# Patient Record
Sex: Female | Born: 1945 | Hispanic: Yes | Marital: Single | Smoking: Never smoker
Health system: Southern US, Community
[De-identification: ages and names within clinical notes are randomized; demographics above are authoritative.]

---

## 2014-08-04 ENCOUNTER — Emergency Department (HOSPITAL_COMMUNITY)
Admission: EM | Admit: 2014-08-04 | Discharge: 2014-08-04 | Disposition: A | Discharge status: Home / Self Care | Payer: Self-pay | Attending: Emergency Medicine | Admitting: Emergency Medicine

## 2014-08-04 ENCOUNTER — Emergency Department (HOSPITAL_COMMUNITY): Payer: Medicare Other

## 2014-08-04 ENCOUNTER — Encounter (HOSPITAL_COMMUNITY): Payer: Self-pay | Admitting: Emergency Medicine

## 2014-08-04 ENCOUNTER — Emergency Department (HOSPITAL_COMMUNITY): Payer: Self-pay

## 2014-08-04 DIAGNOSIS — IMO0001 Reserved for inherently not codable concepts without codable children: Secondary | ICD-10-CM

## 2014-08-04 DIAGNOSIS — S43004A Unspecified dislocation of right shoulder joint, initial encounter: Secondary | ICD-10-CM

## 2014-08-04 DIAGNOSIS — S43031A Inferior subluxation of right humerus, initial encounter: Secondary | ICD-10-CM | POA: Insufficient documentation

## 2014-08-04 DIAGNOSIS — Y9259 Other trade areas as the place of occurrence of the external cause: Secondary | ICD-10-CM | POA: Insufficient documentation

## 2014-08-04 DIAGNOSIS — W010XXA Fall on same level from slipping, tripping and stumbling without subsequent striking against object, initial encounter: Secondary | ICD-10-CM | POA: Insufficient documentation

## 2014-08-04 DIAGNOSIS — Y999 Unspecified external cause status: Secondary | ICD-10-CM | POA: Insufficient documentation

## 2014-08-04 DIAGNOSIS — Y9389 Activity, other specified: Secondary | ICD-10-CM | POA: Insufficient documentation

## 2014-08-04 DIAGNOSIS — S43011A Anterior subluxation of right humerus, initial encounter: Secondary | ICD-10-CM | POA: Insufficient documentation

## 2014-08-04 MED ORDER — ONDANSETRON HCL 4 MG/2ML IJ SOLN
4.0000 mg | Freq: Once | INTRAMUSCULAR | Status: AC
  Administered 2014-08-04: 4 mg via INTRAVENOUS
  Filled 2014-08-04: qty 2

## 2014-08-04 MED ORDER — TRAMADOL HCL 50 MG PO TABS: 50.0000 mg | ORAL_TABLET | Freq: Four times a day (QID) | ORAL | Status: AC | PRN

## 2014-08-04 MED ORDER — ETOMIDATE 2 MG/ML IV SOLN
INTRAVENOUS | Status: AC | PRN
  Administered 2014-08-04: 10 mg via INTRAVENOUS

## 2014-08-04 MED ORDER — SODIUM CHLORIDE 0.9 % IV BOLUS (SEPSIS)
500.0000 mL | Freq: Once | INTRAVENOUS | Status: AC
  Administered 2014-08-04: 500 mL via INTRAVENOUS

## 2014-08-04 MED ORDER — ETOMIDATE 2 MG/ML IV SOLN
10.0000 mg | Freq: Once | INTRAVENOUS | Status: AC
  Filled 2014-08-04: qty 10

## 2014-08-04 NOTE — ED Notes (Signed)
Xray tech at bedside.

## 2014-08-04 NOTE — ED Notes (Signed)
[facility] Expected date:  Expected time:  Means of arrival:  Comments: 69 y/o F fall

## 2014-08-04 NOTE — ED Notes (Signed)
Pt denies any pain in her neck or lower arm. She only has complaints of pain in her right shoulder.

## 2014-08-04 NOTE — Discharge Instructions (Signed)
In patients over 71 years old, the rate of redislocation is lower and early mobilization (after one week) is needed to limit joint stiffness. Gentle pendular motion exercises should be performed during the immobilization period to reduce the risk of frozen shoulder.

## 2014-08-04 NOTE — ED Provider Notes (Signed)
CSN: [id number]     Arrival date & time 08/04/14  1914 History   First MD Initiated Contact with Patient 08/04/14 2022     Chief Complaint  Patient presents with  . Arm Injury     (Consider location/radiation/quality/duration/timing/severity/associated sxs/prior Treatment) Patient is a 69 y.o. female presenting with arm injury. The history is provided by the patient.  Arm Injury Location:  Shoulder Time since incident:  3 hours Injury: yes   Mechanism of injury: fall   Fall:    Fall occurred: from standing.   Height of fall:  Standing   Impact surface:  Hard floor   Point of impact: right arm. Shoulder location:  R shoulder Pain details:    Quality:  Aching   Radiates to:  Does not radiate   Severity:  Moderate   Onset quality:  Sudden   Duration:  3 hours   Timing:  Constant   Progression:  Unchanged Chronicity:  New Dislocation: yes   Prior injury to area:  No Relieved by:  Nothing Worsened by:  Nothing tried Ineffective treatments: fentanyl. Associated symptoms: no back pain, no fatigue, no fever and no neck pain     History reviewed. No pertinent past medical history. History reviewed. No pertinent past surgical history. No family history on file. History  Substance Use Topics  . Smoking status: Never Smoker   . Smokeless tobacco: Not on file  . Alcohol Use: No   OB History    No data available     Review of Systems  Constitutional: Negative for fever and fatigue.  HENT: Negative for congestion and drooling.   Eyes: Negative for pain.  Respiratory: Negative for cough and shortness of breath.   Cardiovascular: Negative for chest pain.  Gastrointestinal: Negative for nausea, vomiting, abdominal pain and diarrhea.  Genitourinary: Negative for dysuria and hematuria.  Musculoskeletal: Negative for back pain, gait problem and neck pain.  Skin: Negative for color change.  Neurological: Negative for dizziness and headaches.  Hematological: Negative for  adenopathy.  Psychiatric/Behavioral: Negative for behavioral problems.  All other systems reviewed and are negative.     Allergies  Review of patient's allergies indicates no known allergies.  Home Medications   Prior to Admission medications   Not on File   BP 205/75 mmHg  Pulse 54  Temp(Src) 98 F (36.7 C) (Oral)  SpO2 90% Physical Exam  Constitutional: She is oriented to person, place, and time. She appears well-developed and well-nourished.  HENT:  Head: Normocephalic.  Mouth/Throat: Oropharynx is clear and moist. No oropharyngeal exudate.  Eyes: Conjunctivae and EOM are normal. Pupils are equal, round, and reactive to light.  Neck: Normal range of motion. Neck supple.  Cardiovascular: Normal rate, regular rhythm, normal heart sounds and intact distal pulses.  Exam reveals no gallop and no friction rub.   No murmur heard. Pulmonary/Chest: Effort normal and breath sounds normal. No respiratory distress. She has no wheezes.  Abdominal: Soft. Bowel sounds are normal. There is no tenderness. There is no rebound and no guarding.  Musculoskeletal: Normal range of motion. She exhibits tenderness. She exhibits no edema.  Tenderness to the right anterior shoulder with deformity consistent with anterior shoulder dislocation.  Normal motor skills in the right hand. Sensation intact in the right upper extremity. 2+ distal pulses in the right upper extremity.  Normal range of motion and strength in all other extremities.  No vertebral tenderness noted.  Neurological: She is alert and oriented to person, place, and time.  Skin: Skin is warm and dry.  Psychiatric: She has a normal mood and affect. Her behavior is normal.  Nursing note and vitals reviewed.   ED Course  Reduction of dislocation Date/Time: 08/04/2014 10:08 PM Performed by: Purvis Sheffield Authorized by: Purvis Sheffield Consent: Verbal consent obtained. Written consent obtained. Risks and benefits: risks,  benefits and alternatives were discussed Consent given by: patient (son) Patient understanding: patient states understanding of the procedure being performed Patient consent: the patient's understanding of the procedure matches consent given Procedure consent: procedure consent matches procedure scheduled Relevant documents: relevant documents present and verified Test results: test results available and properly labeled Site marked: the operative site was marked Imaging studies: imaging studies available Required items: required blood products, implants, devices, and special equipment available Patient identity confirmed: verbally with patient, arm band, provided demographic data and hospital-assigned identification number Time out: Immediately prior to procedure a "time out" was called to verify the correct patient, procedure, equipment, support staff and site/side marked as required. Preparation: Patient was prepped and draped in the usual sterile fashion. Local anesthesia used: no Patient sedated: yes Sedatives: etomidate Sedation start date/time: 08/04/2014 10:08 PM Sedation end date/time: 08/04/2014 10:16 PM Vitals: Vital signs were monitored during sedation. Patient tolerance: Patient tolerated the procedure well with no immediate complications   (including critical care time) Labs Review Labs Reviewed - No data to display  Imaging Review Dg Shoulder Right  08/04/2014   CLINICAL DATA:  Tripped and fell while shopping. Pain in deformity of the right shoulder.  EXAM: RIGHT SHOULDER - 2+ VIEW  COMPARISON:  None.  FINDINGS: There is anterior inferior dislocation of the humeral head relative to the glenoid. No visible fracture, though Hill-Sachs deformity is possible. Large subacromial spur incidentally noted.  IMPRESSION: Anterior inferior dislocation of the humeral head.   Electronically Signed   By: Paulina Fusi M.D.   On: 08/04/2014 20:18     EKG Interpretation None      MDM    Final diagnoses:  Shoulder dislocation, right, initial encounter    9:07 PM 69 y.o. female who presents with a right anterior shoulder dislocation. The patient speaks Spanish and her son is here interpreting. He states they were eating [place] food and relieving around 6 PM when she tripped falling forward and breaking her fall with her right arm. She denies hitting her head or loss of consciousness. She denies any headache. Her current and only complaint is right shoulder pain. She has an anterior and inferior right shoulder dislocation. She is neurovascularly intact on exam. She has had 2 episodes of emesis after getting 150 mg of fentanyl.  11:52 PM: Pt s/p reduction. Plain film reviewed by me. She remains neurovasc intact. Will rec she wear sling and warned against frozen shoulder.  I have discussed the diagnosis/risks/treatment options with the patient and family and believe the pt to be eligible for discharge home to follow-up with Ortho. We also discussed returning to the ED immediately if new or worsening sx occur. We discussed the sx which are most concerning (e.g., worsening pain, weakness) that necessitate immediate return. Medications administered to the patient during their visit and any new prescriptions provided to the patient are listed below.  Medications given during this visit Medications  ondansetron (ZOFRAN) injection 4 mg (4 mg Intravenous Given 08/04/14 1958)  etomidate (AMIDATE) injection 10 mg ( Intravenous Duplicate 08/04/14 2230)  sodium chloride 0.9 % bolus 500 mL (0 mLs Intravenous Stopped 08/04/14 2245)  etomidate (AMIDATE) injection (  10 mg Intravenous Given 08/04/14 2211)    New Prescriptions   TRAMADOL (ULTRAM) 50 MG TABLET    Take 1 tablet (50 mg total) by mouth every 6 (six) hours as needed.     Purvis Sheffield, MD 08/04/14 [phone]

## 2014-08-04 NOTE — ED Notes (Signed)
[place] from [place] and speaks no [place]. Her son is at the bedside and does speak [place] fluently. Pt was shopping, tripped, and caught herself on her right arm. There appears to be a deformity to her right shoulder. EMS put the patient in a C collar because they could not rule out any other injuries and there is a language barrier between EMS and the pt. Her only medical history is of arthritis, and she has no known medication allergies. She was given 150 mg of fentanyl in route and had 1 episode of emesis following the medication.

## 2014-08-04 NOTE — ED Notes (Signed)
MD at bedside. 

## 2016-06-28 IMAGING — DX DG SHOULDER 2+V PORT*R*
1 series · 1 of 1 positions shown · non-contrast
Comparison: 08/04/2014

CLINICAL DATA: Post right shoulder reduction.

EXAM:
PORTABLE RIGHT SHOULDER - 2+ VIEW

[shoulder ap]
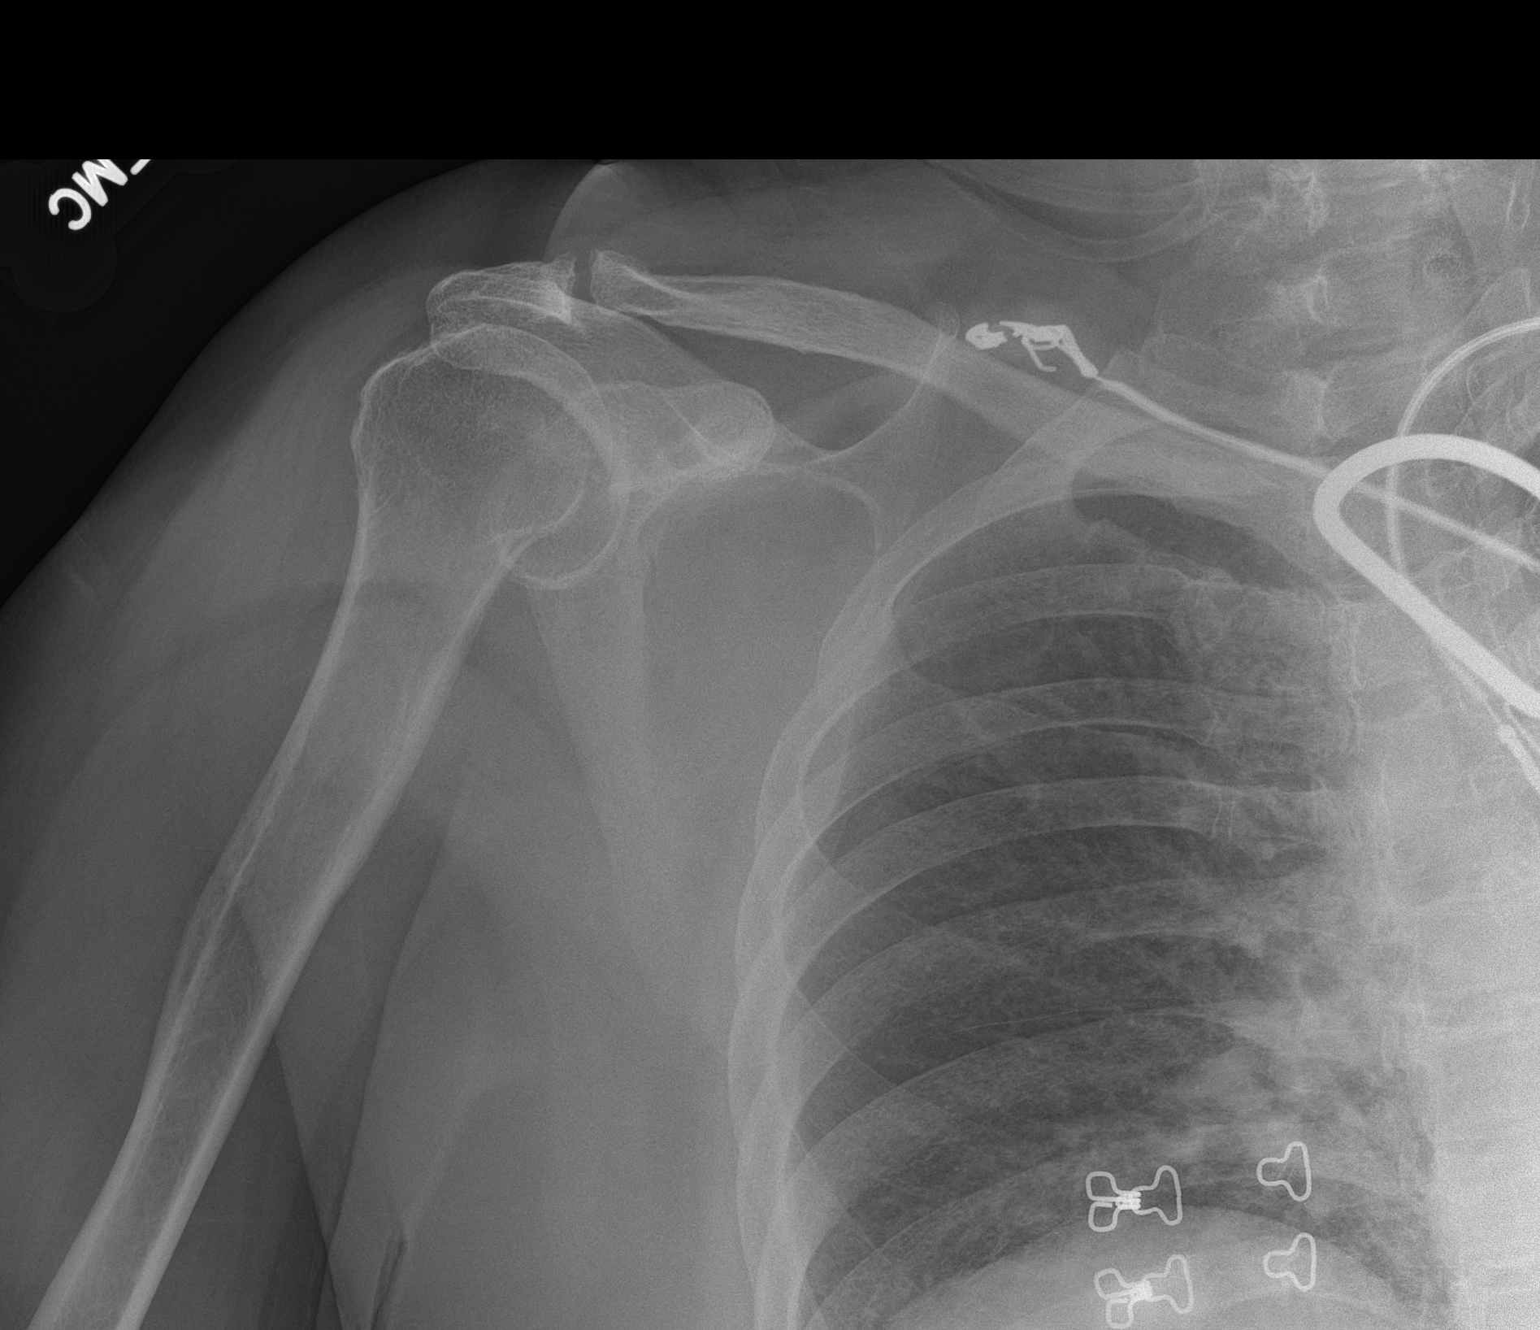

[1 of 1 positions shown; findings below may reference images not displayed]

FINDINGS: Repositioning of the glenohumeral joint of the right shoulder since
prior study. No significant residual dislocation noted on single
view. Degenerative changes are present.
IMPRESSION: Interval relocation of the right shoulder without significant
residual dislocation.

## 2016-06-28 IMAGING — CR DG SHOULDER 2+V*R*
2 series · 2 of 2 positions shown · non-contrast
Comparison: None.

CLINICAL DATA: Tripped and fell while shopping. Pain in deformity
of the right shoulder.

EXAM:
RIGHT SHOULDER - 2+ VIEW

[x shoulder ap right (1 of 2)]
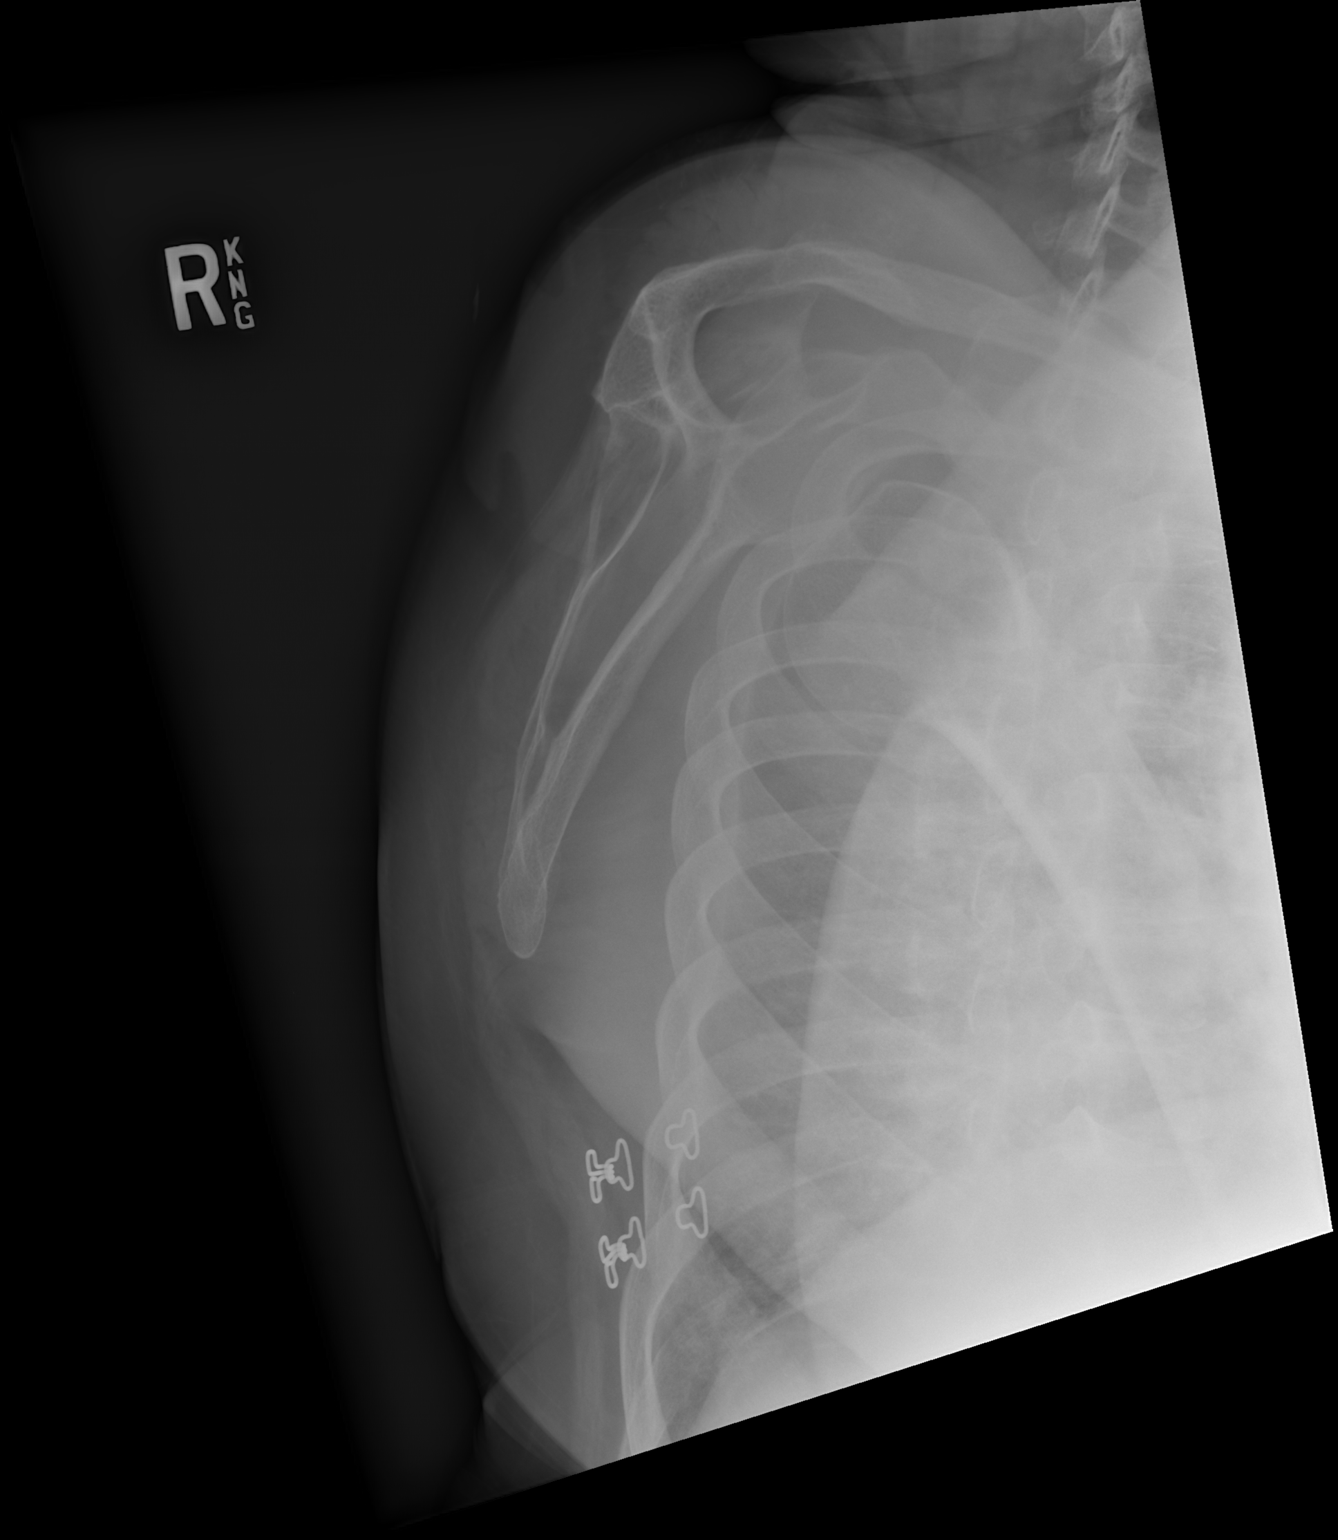

[x shoulder ap right (2 of 2)]
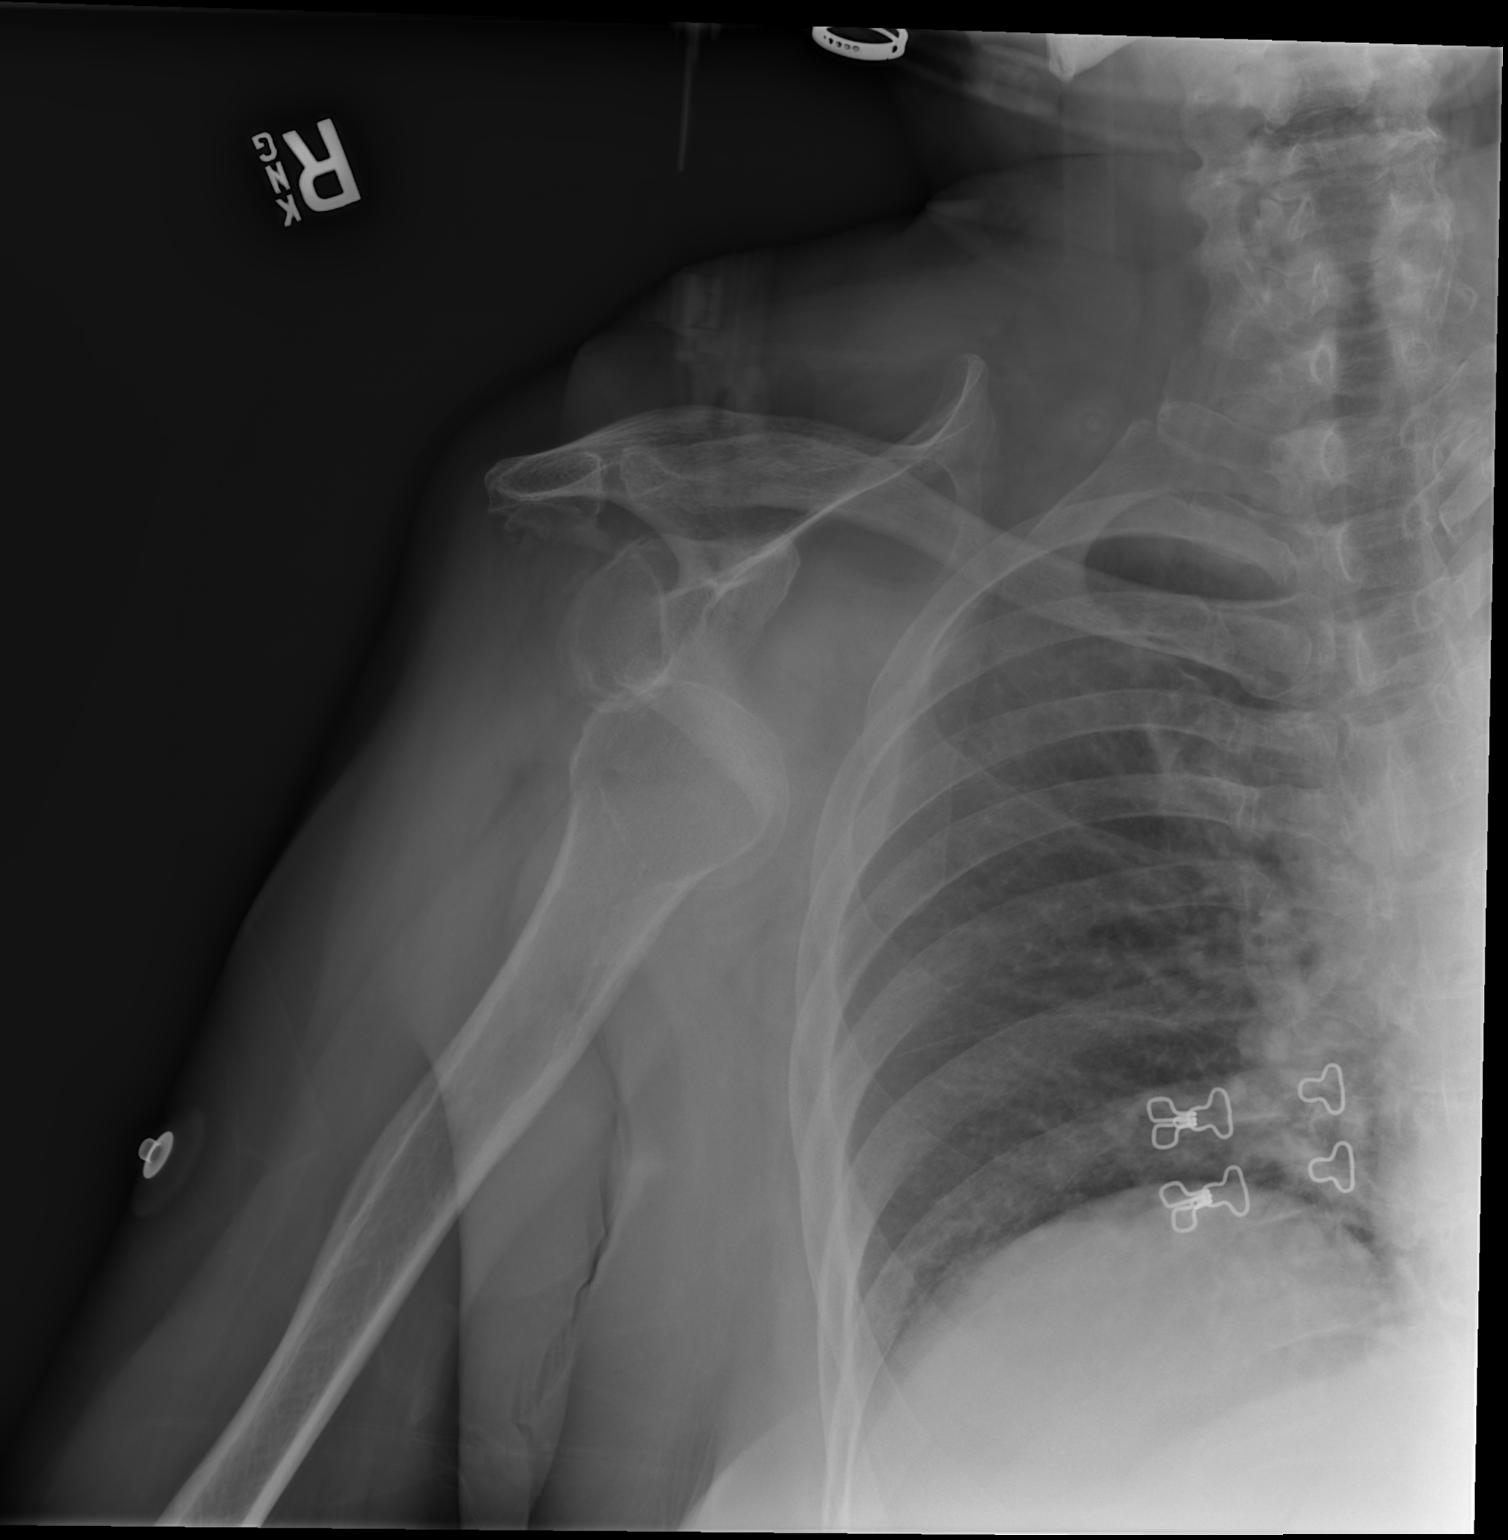

[2 of 2 positions shown; findings below may reference images not displayed]

FINDINGS: There is anterior inferior dislocation of the humeral head relative
to the glenoid. No visible fracture, though Hill-Sachs deformity is
possible. Large subacromial spur incidentally noted.
IMPRESSION: Anterior inferior dislocation of the humeral head.
